# Patient Record
Sex: Male | Born: 1985 | Race: White | Hispanic: No | Marital: Single | Smoking: Former smoker
Health system: Southern US, Community
[De-identification: ages and names within clinical notes are randomized; demographics above are authoritative.]

## PROBLEM LIST (undated history)

## (undated) DIAGNOSIS — S2239XA Fracture of one rib, unspecified side, initial encounter for closed fracture: Secondary | ICD-10-CM

## (undated) DIAGNOSIS — E119 Type 2 diabetes mellitus without complications: Secondary | ICD-10-CM

---

## 2015-12-20 ENCOUNTER — Encounter (HOSPITAL_COMMUNITY): Payer: Self-pay | Admitting: Emergency Medicine

## 2015-12-20 ENCOUNTER — Emergency Department (HOSPITAL_COMMUNITY): Payer: No Typology Code available for payment source

## 2015-12-20 ENCOUNTER — Emergency Department (HOSPITAL_COMMUNITY)
Admission: EM | Admit: 2015-12-20 | Discharge: 2015-12-20 | Disposition: A | Discharge status: Home / Self Care | Payer: No Typology Code available for payment source | Attending: Emergency Medicine | Admitting: Emergency Medicine

## 2015-12-20 DIAGNOSIS — S2231XA Fracture of one rib, right side, initial encounter for closed fracture: Secondary | ICD-10-CM | POA: Insufficient documentation

## 2015-12-20 DIAGNOSIS — W208XXA Other cause of strike by thrown, projected or falling object, initial encounter: Secondary | ICD-10-CM | POA: Insufficient documentation

## 2015-12-20 DIAGNOSIS — Y9389 Activity, other specified: Secondary | ICD-10-CM | POA: Insufficient documentation

## 2015-12-20 DIAGNOSIS — Y998 Other external cause status: Secondary | ICD-10-CM | POA: Insufficient documentation

## 2015-12-20 DIAGNOSIS — S3991XA Unspecified injury of abdomen, initial encounter: Secondary | ICD-10-CM | POA: Insufficient documentation

## 2015-12-20 DIAGNOSIS — Y9289 Other specified places as the place of occurrence of the external cause: Secondary | ICD-10-CM | POA: Insufficient documentation

## 2015-12-20 DIAGNOSIS — F17200 Nicotine dependence, unspecified, uncomplicated: Secondary | ICD-10-CM | POA: Insufficient documentation

## 2015-12-20 DIAGNOSIS — R109 Unspecified abdominal pain: Secondary | ICD-10-CM

## 2015-12-20 LAB — I-STAT CHEM 8, ED
BUN: 14 mg/dL (ref 6–20)
CREATININE: 1 mg/dL (ref 0.61–1.24)
Calcium, Ion: 1.16 mmol/L (ref 1.12–1.23)
Chloride: 103 mmol/L (ref 101–111)
GLUCOSE: 186 mg/dL — AB (ref 65–99)
HCT: 48 % (ref 39.0–52.0)
Hemoglobin: 16.3 g/dL (ref 13.0–17.0)
POTASSIUM: 4.1 mmol/L (ref 3.5–5.1)
Sodium: 139 mmol/L (ref 135–145)
TCO2: 26 mmol/L (ref 0–100)

## 2015-12-20 MED ORDER — OXYCODONE-ACETAMINOPHEN 5-325 MG PO TABS
1.0000 | ORAL_TABLET | Freq: Once | ORAL | Status: AC
  Administered 2015-12-20: 1 via ORAL
  Filled 2015-12-20: qty 1

## 2015-12-20 MED ORDER — IOPAMIDOL (ISOVUE-300) INJECTION 61%
100.0000 mL | Freq: Once | INTRAVENOUS | Status: AC | PRN
  Administered 2015-12-20: 100 mL via INTRAVENOUS

## 2015-12-20 MED ORDER — OXYCODONE-ACETAMINOPHEN 5-325 MG PO TABS: 2.0000 | ORAL_TABLET | Freq: Four times a day (QID) | ORAL | Status: DC | PRN

## 2015-12-20 NOTE — Discharge Instructions (Signed)

## 2015-12-20 NOTE — ED Notes (Signed)
Pt states that he dropped some weights on his R rib cage area 1 week ago[facility] No break but still has pain. Alert and oriented.

## 2015-12-20 NOTE — ED Provider Notes (Signed)
CSN: [id number]     Arrival date & time 12/20/15  1552 History  By signing my name below, I, Linna Darner, attest that this documentation has been prepared under the direction and in the presence of non-physician practitioner, Alveta Heimlich, PA-C. Electronically Signed: Linna Darner, Scribe. 12/20/2015. 4:14 PM.  Chief Complaint  Patient presents with  . Rib Pain    The history is provided by the patient. No language interpreter was used.    HPI Comments: Jackson Turner is a 30 y.o. male with no pertinent PMHx who presents to the Emergency Department complaining of sudden onset, constant, severe, right ribcage pain s/p dropping weights on his right ribcage approximately 1 week ago. Pt notes that he was bench pressing 220 lb without a spotter and became stuck; he was unable to place the bar on the hooks and dropped the weight on his right ribs. Pt has been experiencing excruciating popping sensations in his right ribs. He also notes severe RUQ and epigastric abdominal pain. He reports significant right rib and RUQ pain exacerbation with palpation, movement, breathing, laughing and sneezing. He notes that he has been breathing shallowly to prevent pain. He has been to the [place] for his symptoms and was x-rayed; he was told that he probably has a right rib fracture and discharged with tramadol. Denies difficulty breathing, hemoptysis, nausea or vomiting. Pt denies any other pains at this time.  History reviewed. No pertinent past medical history. History reviewed. No pertinent past surgical history. No family history on file. Social History  Substance Use Topics  . Smoking status: Current Every Day Smoker  . Smokeless tobacco: None  . Alcohol Use: Yes    Review of Systems  Cardiovascular: Positive for chest pain (right ribs).  Gastrointestinal: Positive for abdominal pain (RUQ).  Musculoskeletal: Negative for back pain.  All other systems reviewed and are negative.   Allergies  Review of  patient's allergies indicates no known allergies.  Home Medications   Prior to Admission medications   Medication Sig Start Date End Date Taking? Authorizing Provider  oxyCODONE-acetaminophen (PERCOCET/ROXICET) 5-325 MG tablet Take 2 tablets by mouth every 6 (six) hours as needed for severe pain. 12/20/15   Kazuko Clemence, PA-C   BP 150/111 mmHg  Pulse 98  Temp(Src) 97.7 F (36.5 C) (Oral)  Resp 18  SpO2 100% Physical Exam  Constitutional: He is oriented to person, place, and time. He appears well-developed and well-nourished. No distress.  HENT:  Head: Normocephalic and atraumatic.  Eyes: Conjunctivae and EOM are normal.  Neck: Neck supple. No tracheal deviation present.  Cardiovascular: Normal rate, regular rhythm and normal heart sounds.   Pulmonary/Chest: Effort normal and breath sounds normal. No respiratory distress. He has no wheezes. He has no rales.    Tenderness to right anterior chest wall. No bony deformities. No flail chest, crepitus or retractions. Breathing unlabored. Lungs CTAB with good air movement in all lung fields.   Abdominal: There is tenderness in the right upper quadrant and epigastric area. There is guarding (voluntary). There is no rigidity and no rebound.    Tenderness of the right upper quadrant and epigastric region. No rebound or rigidity. Mild voluntary guarding.  Musculoskeletal: Normal range of motion.  Neurological: He is alert and oriented to person, place, and time.  Skin: Skin is warm and dry.  Psychiatric: He has a normal mood and affect. His behavior is normal.  Nursing note and vitals reviewed.   ED Course  Procedures (including critical care time)  DIAGNOSTIC STUDIES: Oxygen Saturation is 100% on RA, normal by my interpretation.    COORDINATION OF CARE: 4:15 PM Will order DG Ribs Unilateral w/ Chest Right. Will order i-Stat Chem 8. Discussed treatment plan with pt at bedside and pt agreed to plan.  Labs Review Labs Reviewed  I-STAT  CHEM 8, ED - Abnormal; Notable for the following:    Glucose, Bld 186 (*)    All other components within normal limits    Imaging Review Dg Ribs Unilateral W/chest Right  12/20/2015  CLINICAL DATA:  Dropped weight is on right rib cage 1 week ago. Pain to right anterior inferior ribs. EXAM: RIGHT RIBS AND CHEST - 3+ VIEW COMPARISON:  None. FINDINGS: There is a fracture involving the anterior aspect of the right tenth rib. No additional fracture or subluxation identified. No radiopaque foreign bodies. There is no evidence of pneumothorax or pleural effusion. Both lungs are clear. Heart size and mediastinal contours are within normal limits. IMPRESSION: 1. Small fracture involves the anterior aspect of the right tenth rib. Electronically Signed   By: Signa Kellaylor  Stroud M.D.   On: 12/20/2015 17:02   Ct Abdomen Pelvis W Contrast  12/20/2015  CLINICAL DATA:  Sudden onset, constant, severe, right ribcage pain s/p dropping weights on his right ribcage approximately 1 week ago. Pt notes that he was bench pressing 220 lb without a spotter and became stuck; he was unable to place the bar on the hooks and dropped the weight on his right ribs. Pt has been experiencing excruciating popping sensations in his right ribs EXAM: CT ABDOMEN AND PELVIS WITH CONTRAST TECHNIQUE: Multidetector CT imaging of the abdomen and pelvis was performed using the standard protocol following bolus administration of intravenous contrast. CONTRAST:  100mL ISOVUE-300 IOPAMIDOL (ISOVUE-300) INJECTION 61% COMPARISON:  None FINDINGS: Lung bases:  Clear.  Heart normal size. At liver, spleen, gallbladder, pancreas, adrenal glands:  Normal. Kidneys, ureters, bladder:  Unremarkable. Lymph nodes:  No adenopathy. Ascites:  None. Gastrointestinal: Unremarkable. Normal appendix. Normal mesenteric. Abdominal wall: Unremarkable. Musculoskeletal:  No fractures.  Unremarkable. IMPRESSION: 1. Normal enhanced CT scan of the abdomen pelvis. Electronically Signed    By: Amie Portlandavid  Ormond M.D.   On: 12/20/2015 17:51   I have personally reviewed and evaluated these lab results as part of my medical decision-making.   EKG Interpretation None      MDM   Final diagnoses:  Abdominal pain  Right rib fracture, closed, initial encounter   Pt presenting with rib pain after dropping bench press onto his chest 1 week ago. Tenderness over right lateral and anterior chest wall. Patient with good air movement, no hemoptysis and no decreased breath sounds on exam. Right 10th nondisplaced rib fracture and no pneumothorax on x-ray. Abdomen is soft with RUQ and epigastric tenderness with small amount of voluntary guarding. Abd CT without signs of intrabdominal trauma. Patient given incentive spirometer and will discharge home with short course of pain medication. Patient also advised to followup with PCP if not improved in one week. I have also discussed reasons to return immediately to the ER including difficulty breathing and hemoptysis. Patient expresses understanding and agrees with plan.  I personally performed the services described in this documentation, which was scribed in my presence. The recorded information has been reviewed and is accurate.   Alveta HeimlichStevi Sy Saintjean, PA-C 12/20/15 1831  Melene Planan Floyd, DO 12/20/15 2250

## 2015-12-25 ENCOUNTER — Emergency Department (HOSPITAL_COMMUNITY)
Admission: EM | Admit: 2015-12-25 | Discharge: 2015-12-25 | Disposition: A | Discharge status: Home / Self Care | Payer: Non-veteran care | Attending: Emergency Medicine | Admitting: Emergency Medicine

## 2015-12-25 ENCOUNTER — Encounter (HOSPITAL_COMMUNITY): Payer: Self-pay | Admitting: Emergency Medicine

## 2015-12-25 ENCOUNTER — Emergency Department (HOSPITAL_COMMUNITY): Payer: Non-veteran care

## 2015-12-25 DIAGNOSIS — S29001S Unspecified injury of muscle and tendon of front wall of thorax, sequela: Secondary | ICD-10-CM | POA: Diagnosis present

## 2015-12-25 DIAGNOSIS — W208XXS Other cause of strike by thrown, projected or falling object, sequela: Secondary | ICD-10-CM | POA: Insufficient documentation

## 2015-12-25 DIAGNOSIS — F17200 Nicotine dependence, unspecified, uncomplicated: Secondary | ICD-10-CM | POA: Insufficient documentation

## 2015-12-25 DIAGNOSIS — S2241XS Multiple fractures of ribs, right side, sequela: Secondary | ICD-10-CM | POA: Diagnosis not present

## 2015-12-25 DIAGNOSIS — S2231XS Fracture of one rib, right side, sequela: Secondary | ICD-10-CM

## 2015-12-25 MED ORDER — HYDROCODONE-ACETAMINOPHEN 5-325 MG PO TABS: 1.0000 | ORAL_TABLET | Freq: Four times a day (QID) | ORAL | Status: DC | PRN

## 2015-12-25 MED ORDER — HYDROCODONE-ACETAMINOPHEN 5-325 MG PO TABS
1.0000 | ORAL_TABLET | Freq: Once | ORAL | Status: AC
  Administered 2015-12-25: 1 via ORAL
  Filled 2015-12-25: qty 1

## 2015-12-25 MED ORDER — METHOCARBAMOL 500 MG PO TABS: 500.0000 mg | ORAL_TABLET | Freq: Two times a day (BID) | ORAL | Status: DC

## 2015-12-25 MED ORDER — DIAZEPAM 5 MG PO TABS
5.0000 mg | ORAL_TABLET | Freq: Once | ORAL | Status: AC
  Administered 2015-12-25: 5 mg via ORAL
  Filled 2015-12-25: qty 1
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
## 2015-12-25 NOTE — ED Notes (Signed)
Pt c/o R rib pain, pt states he was recently seen for rib fractures and believes he re-injured it/them today doing "something personal". Pt states pain now worse.

## 2015-12-25 NOTE — Discharge Instructions (Signed)
1. Medications: vicodin, robaxin, usual home medications 2. Treatment: rest, drink plenty of fluids,  3. Follow Up: Please followup with your primary doctor in 3-5 days for discussion of your diagnoses and further evaluation after today's visit; if you do not have a primary care doctor use the resource guide provided to find one; Please return to the ER for difficulty breathing, fevers or other concerns    Rib Fracture A rib fracture is a break or crack in one of the bones of the ribs. The ribs are a group of long, curved bones that wrap around your chest and attach to your spine. They protect your lungs and other organs in the chest cavity. A broken or cracked rib is often painful, but most do not cause other problems. Most rib fractures heal on their own over time. However, rib fractures can be more serious if multiple ribs are broken or if broken ribs move out of place and push against other structures. CAUSES   A direct blow to the chest. For example, this could happen during contact sports, a car accident, or a fall against a hard object.  Repetitive movements with high force, such as pitching a baseball or having severe coughing spells. SYMPTOMS   Pain when you breathe in or cough.  Pain when someone presses on the injured area. DIAGNOSIS  Your caregiver will perform a physical exam. Various imaging tests may be ordered to confirm the diagnosis and to look for related injuries. These tests may include a chest X-ray, computed tomography (CT), magnetic resonance imaging (MRI), or a bone scan. TREATMENT  Rib fractures usually heal on their own in 1-3 months. The longer healing period is often associated with a continued cough or other aggravating activities. During the healing period, pain control is very important. Medication is usually given to control pain. Hospitalization or surgery may be needed for more severe injuries, such as those in which multiple ribs are broken or the ribs have  moved out of place.  HOME CARE INSTRUCTIONS   Avoid strenuous activity and any activities or movements that cause pain. Be careful during activities and avoid bumping the injured rib.  Gradually increase activity as directed by your caregiver.  Only take over-the-counter or prescription medications as directed by your caregiver. Do not take other medications without asking your caregiver first.  Apply ice to the injured area for the first 1-2 days after you have been treated or as directed by your caregiver. Applying ice helps to reduce inflammation and pain.  Put ice in a plastic bag.  Place a towel between your skin and the bag.   Leave the ice on for 15-20 minutes at a time, every 2 hours while you are awake.  Perform deep breathing as directed by your caregiver. This will help prevent pneumonia, which is a common complication of a broken rib. Your caregiver may instruct you to:  Take deep breaths several times a day.  Try to cough several times a day, holding a pillow against the injured area.  Use a device called an incentive spirometer to practice deep breathing several times a day.  Drink enough fluids to keep your urine clear or pale yellow. This will help you avoid constipation.   Do not wear a rib belt or binder. These restrict breathing, which can lead to pneumonia.  SEEK IMMEDIATE MEDICAL CARE IF:   You have a fever.   You have difficulty breathing or shortness of breath.   You develop a continual  cough, or you cough up thick or bloody sputum.  You feel sick to your stomach (nausea), throw up (vomit), or have abdominal pain.   You have worsening pain not controlled with medications.  MAKE SURE YOU:  Understand these instructions.  Will watch your condition.  Will get help right away if you are not doing well or get worse.   This information is not intended to replace advice given to you by your health care provider. Make sure you discuss any questions  you have with your health care provider.   Document Released: 08/29/2005 Document Revised: 05/01/2013 Document Reviewed: 10/31/2012 Elsevier Interactive Patient Education [employer]

## 2015-12-30 ENCOUNTER — Emergency Department (HOSPITAL_COMMUNITY): Payer: Non-veteran care

## 2015-12-30 ENCOUNTER — Emergency Department (HOSPITAL_COMMUNITY)
Admission: EM | Admit: 2015-12-30 | Discharge: 2015-12-30 | Disposition: A | Discharge status: Home / Self Care | Payer: Non-veteran care | Attending: Emergency Medicine | Admitting: Emergency Medicine

## 2015-12-30 ENCOUNTER — Encounter (HOSPITAL_COMMUNITY): Payer: Self-pay | Admitting: Emergency Medicine

## 2015-12-30 DIAGNOSIS — Z79899 Other long term (current) drug therapy: Secondary | ICD-10-CM | POA: Insufficient documentation

## 2015-12-30 DIAGNOSIS — Y9289 Other specified places as the place of occurrence of the external cause: Secondary | ICD-10-CM | POA: Diagnosis not present

## 2015-12-30 DIAGNOSIS — Y9389 Activity, other specified: Secondary | ICD-10-CM | POA: Diagnosis not present

## 2015-12-30 DIAGNOSIS — S29001A Unspecified injury of muscle and tendon of front wall of thorax, initial encounter: Secondary | ICD-10-CM | POA: Diagnosis present

## 2015-12-30 DIAGNOSIS — W208XXA Other cause of strike by thrown, projected or falling object, initial encounter: Secondary | ICD-10-CM | POA: Insufficient documentation

## 2015-12-30 DIAGNOSIS — Y998 Other external cause status: Secondary | ICD-10-CM | POA: Diagnosis not present

## 2015-12-30 DIAGNOSIS — S2241XA Multiple fractures of ribs, right side, initial encounter for closed fracture: Secondary | ICD-10-CM | POA: Insufficient documentation

## 2015-12-30 DIAGNOSIS — F17200 Nicotine dependence, unspecified, uncomplicated: Secondary | ICD-10-CM | POA: Diagnosis not present

## 2015-12-30 DIAGNOSIS — S2231XA Fracture of one rib, right side, initial encounter for closed fracture: Secondary | ICD-10-CM

## 2015-12-30 MED ORDER — MELOXICAM 15 MG PO TABS: 15.0000 mg | ORAL_TABLET | Freq: Every day | ORAL | Status: DC

## 2015-12-30 MED ORDER — OXYCODONE-ACETAMINOPHEN 5-325 MG PO TABS: 1.0000 | ORAL_TABLET | Freq: Four times a day (QID) | ORAL | Status: DC | PRN

## 2015-12-30 NOTE — ED Provider Notes (Signed)
CSN: [id number]     Arrival date & time 12/30/15  1921 History  By signing my name below, I, Freida BusmanDiana Omoyeni, attest that this documentation has been prepared under the direction and in the presence of non-physician practitioner, Dorthula Matasiffany G Lizzette Carbonell, PA-C. Electronically Signed: Freida Busmaniana Omoyeni, Scribe. 12/30/2015. 9:51 PM.    Chief Complaint  Patient presents with  . Shortness of Breath    The history is provided by the patient. No language interpreter was used.    HPI Comments:  Jackson Natalodd Klinke is a 30 y.o. male who presents to the Emergency Department complaining of increased pain to specific areas of the right ribs since yesterday after coughing fit. Pt notes he dropped a barbell on his chest ~ 2 weeks ago. He was evaluated in the ED after the incident and diagnosed with right rib fractures. Pt reports associated nausea. He also states he feels like he has not been expelling enough air due to pain.  He denies fever and chills.No alleviating factors noted. He notes he is out of the pain meds he was discharged with. He has not had any fevers, sweats, SOB, dyspnea on exertion.   History reviewed. No pertinent past medical history. History reviewed. No pertinent past surgical history. No family history on file. Social History  Substance Use Topics  . Smoking status: Current Every Day Smoker  . Smokeless tobacco: None  . Alcohol Use: Yes    Review of Systems  Constitutional: Negative for fever.  Musculoskeletal:       + Rib pain   Neurological: Negative for weakness.    Allergies  Review of patient's allergies indicates no known allergies.  Home Medications   Prior to Admission medications   Medication Sig Start Date End Date Taking? Authorizing Provider  buPROPion (WELLBUTRIN XL) 150 MG 24 hr tablet Take 450 mg by mouth daily.   Yes Historical Provider, MD  methocarbamol (ROBAXIN) 500 MG tablet Take 1 tablet (500 mg total) by mouth 2 (two) times daily. 12/25/15  Yes Hannah Muthersbaugh,  PA-C  sodium chloride (OCEAN) 0.65 % SOLN nasal spray Place 1 spray into both nostrils daily as needed for congestion.   Yes Historical Provider, MD  HYDROcodone-acetaminophen (NORCO/VICODIN) 5-325 MG tablet Take 1 tablet by mouth every 6 (six) hours as needed for moderate pain or severe pain. Patient not taking: Reported on 12/30/2015 12/25/15   Dahlia ClientHannah Muthersbaugh, PA-C  meloxicam (MOBIC) 15 MG tablet Take 1 tablet (15 mg total) by mouth daily. 12/30/15   Marlon Peliffany Rehmat Murtagh, PA-C  oxyCODONE-acetaminophen (PERCOCET/ROXICET) 5-325 MG tablet Take 2 tablets by mouth every 6 (six) hours as needed for severe pain. Patient not taking: Reported on 12/30/2015 12/20/15   Rolm GalaStevi Barrett, PA-C  oxyCODONE-acetaminophen (PERCOCET/ROXICET) 5-325 MG tablet Take 1 tablet by mouth every 6 (six) hours as needed for severe pain. 12/30/15   Elek Holderness Neva SeatGreene, PA-C   BP 146/116 mmHg  Pulse 108  Temp(Src) 98 F (36.7 C) (Oral)  Resp 20  SpO2 100% Physical Exam  Constitutional: He is oriented to person, place, and time. He appears well-developed and well-nourished. No distress.  HENT:  Head: Normocephalic and atraumatic.  Eyes: Conjunctivae are normal.  Cardiovascular: Normal rate.   Pulmonary/Chest: Effort normal and breath sounds normal. No accessory muscle usage. No respiratory distress. Chest wall is not dull to percussion. He exhibits tenderness and bony tenderness. He exhibits no mass, no laceration, no crepitus, no edema, no deformity, no swelling and no retraction.    Abdominal: He exhibits no distension.  Neurological: He is alert and oriented to person, place, and time.  Skin: Skin is warm and dry.  Psychiatric: He has a normal mood and affect.  Nursing note and vitals reviewed.   ED Course  Procedures   DIAGNOSTIC STUDIES:  Oxygen Saturation is 100% on RA, normal by my interpretation.    COORDINATION OF CARE:  9:35 PM Pt updated with results. Pt encouraged to use incentive spirometer  at least once a  day. Will refer to ortho and rx for mobic and percocet.  Discussed treatment plan with pt at bedside and pt agreed to plan. The chest xray is showing stable healing with no worsening or acute findings. Lungs are clear on exam. He his oxygen level is 100% on room air. Considered other etiology of pain but he is tender very specifically over his broken ribs and when he moves. Discussed return precautions and he will be given a referral to Ortho today since this is his 3rd visit, I highly recommend he f/u with the specialist.  Imaging Review Dg Ribs Unilateral W/chest Right  12/30/2015  CLINICAL DATA:  Right rib fractures. Worsening right rib pain. Coughing. Dyspnea. Subsequent encounter. EXAM: RIGHT RIBS AND CHEST - 3+ VIEW COMPARISON:  12/25/2015 FINDINGS: Nondisplaced fractures of the right anterior eighth and ninth ribs are again seen, and unchanged in appearance since previous study. No other rib fractures or bone lesions identified. No evidence of pneumothorax or hemothorax. Both lungs are clear. Heart size and mediastinal contours are within normal limits. IMPRESSION: Stable appearance of nondisplaced fractures of right anterior eighth and ninth ribs. No evidence of pneumothorax or other active lung disease. Electronically Signed   By: Myles Rosenthal M.D.   On: 12/30/2015 20:26   I have personally reviewed and evaluated these images and lab results as part of my medical decision-making.    MDM   Final diagnoses:  Rib fractures, right, closed, initial encounter   I personally performed the services described in this documentation, which was scribed in my presence. The recorded information has been reviewed and is accurate.    Marlon Pel, PA-C 12/30/15 2152  Loren Racer, MD 01/02/16 [phone]

## 2015-12-30 NOTE — Discharge Instructions (Signed)

## 2015-12-30 NOTE — ED Notes (Signed)
Patient was alert, oriented and stable upon discharge. RN went over AVS and patient had no further questions.  

## 2015-12-30 NOTE — ED Notes (Signed)
Pt returning to worsening R rib pain known anterior rib fx's. Pt states quality of pain changed yesterday after coughing, pt states he is now having difficulty breathing. Pt he feels as if he is unable to exhale completely. VSS

## 2016-10-31 ENCOUNTER — Encounter (HOSPITAL_COMMUNITY): Payer: Self-pay | Admitting: *Deleted

## 2016-10-31 ENCOUNTER — Emergency Department (HOSPITAL_COMMUNITY)
Admission: EM | Admit: 2016-10-31 | Discharge: 2016-10-31 | Disposition: A | Discharge status: Home / Self Care | Payer: Self-pay | Attending: Emergency Medicine | Admitting: Emergency Medicine

## 2016-10-31 ENCOUNTER — Emergency Department (HOSPITAL_COMMUNITY): Payer: Self-pay

## 2016-10-31 DIAGNOSIS — W19XXXA Unspecified fall, initial encounter: Secondary | ICD-10-CM

## 2016-10-31 DIAGNOSIS — W1789XA Other fall from one level to another, initial encounter: Secondary | ICD-10-CM | POA: Insufficient documentation

## 2016-10-31 DIAGNOSIS — Y929 Unspecified place or not applicable: Secondary | ICD-10-CM | POA: Insufficient documentation

## 2016-10-31 DIAGNOSIS — S161XXA Strain of muscle, fascia and tendon at neck level, initial encounter: Secondary | ICD-10-CM | POA: Insufficient documentation

## 2016-10-31 DIAGNOSIS — Z87891 Personal history of nicotine dependence: Secondary | ICD-10-CM | POA: Insufficient documentation

## 2016-10-31 DIAGNOSIS — Y999 Unspecified external cause status: Secondary | ICD-10-CM | POA: Insufficient documentation

## 2016-10-31 DIAGNOSIS — Z79899 Other long term (current) drug therapy: Secondary | ICD-10-CM | POA: Insufficient documentation

## 2016-10-31 DIAGNOSIS — R930 Abnormal findings on diagnostic imaging of skull and head, not elsewhere classified: Secondary | ICD-10-CM | POA: Insufficient documentation

## 2016-10-31 DIAGNOSIS — S20219A Contusion of unspecified front wall of thorax, initial encounter: Secondary | ICD-10-CM | POA: Insufficient documentation

## 2016-10-31 DIAGNOSIS — Y9302 Activity, running: Secondary | ICD-10-CM | POA: Insufficient documentation

## 2016-10-31 HISTORY — DX: Fracture of one rib, unspecified side, initial encounter for closed fracture: S22.39XA

## 2016-10-31 LAB — COMPREHENSIVE METABOLIC PANEL
ALT: 55 U/L (ref 17–63)
AST: 70 U/L — AB (ref 15–41)
Albumin: 4.4 g/dL (ref 3.5–5.0)
Alkaline Phosphatase: 132 U/L — ABNORMAL HIGH (ref 38–126)
Anion gap: 10 (ref 5–15)
BILIRUBIN TOTAL: 1.1 mg/dL (ref 0.3–1.2)
BUN: 15 mg/dL (ref 6–20)
CO2: 27 mmol/L (ref 22–32)
CREATININE: 1.11 mg/dL (ref 0.61–1.24)
Calcium: 9.6 mg/dL (ref 8.9–10.3)
Chloride: 98 mmol/L — ABNORMAL LOW (ref 101–111)
GFR calc Af Amer: >60 mL/min (ref >60)
Glucose, Bld: 305 mg/dL — ABNORMAL HIGH (ref 65–99)
POTASSIUM: 4.5 mmol/L (ref 3.5–5.1)
Sodium: 135 mmol/L (ref 135–145)
Total Protein: 8.2 g/dL — ABNORMAL HIGH (ref 6.5–8.1)

## 2016-10-31 LAB — CBC WITH DIFFERENTIAL/PLATELET
BASOS ABS: 0 10*3/uL (ref 0.0–0.1)
Basophils Relative: 0 %
Eosinophils Absolute: 0.1 10*3/uL (ref 0.0–0.7)
Eosinophils Relative: 1 %
HCT: 41.6 % (ref 39.0–52.0)
Hemoglobin: 14.5 g/dL (ref 13.0–17.0)
Lymphocytes Relative: 17 %
Lymphs Abs: 1.1 10*3/uL (ref 0.7–4.0)
MCH: 30.1 pg (ref 26.0–34.0)
MCHC: 34.9 g/dL (ref 30.0–36.0)
MCV: 86.3 fL (ref 78.0–100.0)
MONO ABS: 0.6 10*3/uL (ref 0.1–1.0)
Monocytes Relative: 8 %
NEUTROS ABS: 5 10*3/uL (ref 1.7–7.7)
Neutrophils Relative %: 74 %
Platelets: 196 10*3/uL (ref 150–400)
RBC: 4.82 MIL/uL (ref 4.22–5.81)
RDW: 12.5 % (ref 11.5–15.5)
WBC: 6.8 10*3/uL (ref 4.0–10.5)

## 2016-10-31 MED ORDER — ONDANSETRON HCL 4 MG/2ML IJ SOLN
4.0000 mg | Freq: Once | INTRAMUSCULAR | Status: AC
  Administered 2016-10-31: 4 mg via INTRAVENOUS
  Filled 2016-10-31: qty 2

## 2016-10-31 MED ORDER — CYCLOBENZAPRINE HCL 10 MG PO TABS: 10.0000 mg | ORAL_TABLET | Freq: Two times a day (BID) | ORAL | 0 refills | Status: AC | PRN

## 2016-10-31 MED ORDER — KETOROLAC TROMETHAMINE 15 MG/ML IJ SOLN
15.0000 mg | Freq: Once | INTRAMUSCULAR | Status: AC
  Administered 2016-10-31: 15 mg via INTRAVENOUS
  Filled 2016-10-31: qty 1

## 2016-10-31 MED ORDER — SODIUM CHLORIDE 0.9 % IV BOLUS (SEPSIS)
1000.0000 mL | Freq: Once | INTRAVENOUS | Status: AC
Start: 2016-10-31 — End: 2016-10-31
  Administered 2016-10-31: 1000 mL via INTRAVENOUS

## 2016-10-31 MED ORDER — TRAMADOL HCL 50 MG PO TABS: 50.0000 mg | ORAL_TABLET | Freq: Four times a day (QID) | ORAL | 0 refills | Status: AC | PRN

## 2016-10-31 MED ORDER — MORPHINE SULFATE (PF) 4 MG/ML IV SOLN
4.0000 mg | Freq: Once | INTRAVENOUS | Status: AC
  Administered 2016-10-31: 4 mg via INTRAVENOUS
  Filled 2016-10-31: qty 1

## 2016-10-31 MED ORDER — CYCLOBENZAPRINE HCL 10 MG PO TABS
10.0000 mg | ORAL_TABLET | Freq: Once | ORAL | Status: AC
  Administered 2016-10-31: 10 mg via ORAL
  Filled 2016-10-31: qty 1

## 2016-10-31 NOTE — ED Notes (Signed)
DG at bedside. 
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
## 2016-10-31 NOTE — ED Provider Notes (Signed)
[facility]-EMERGENCY DEPT Provider Note   CSN: [id number] Arrival date & time: 10/31/16  1047     History   Chief Complaint Chief Complaint  Patient presents with  . Fall    HPI Jackson Natalodd Wolfrey is a 31 y.o. male.  HPI  Patient [place] [facility] [employer] yesterday and flipped over a rope and fell down hill with grass/dirt approximately 10 feet. Reports that when he fell he landed on the back of his neck. Reports pain was present yesterday but is more severe today. Located over sternum, neck, upper back.  No LOC however nausea today.  Pain with breathing. Denies numbness/weakness.   Past Medical History:  Diagnosis Date  . Rib fracture     There are no active problems to display for this patient.   History reviewed. No pertinent surgical history.     Home Medications    Prior to Admission medications   Medication Sig Start Date End Date Taking? Authorizing Provider  albuterol (PROVENTIL HFA;VENTOLIN HFA) 108 (90 Base) MCG/ACT inhaler Inhale 1-2 puffs into the lungs every 6 (six) hours as needed for wheezing or shortness of breath.   Yes Historical Provider, MD  buPROPion (WELLBUTRIN XL) 300 MG 24 hr tablet Take 300 mg by mouth daily.   Yes Historical Provider, MD  omeprazole (PRILOSEC) 20 MG capsule Take 20 mg by mouth daily.   Yes Historical Provider, MD  sodium chloride (OCEAN) 0.65 % SOLN nasal spray Place 1 spray into both nostrils daily as needed for congestion.   Yes Historical Provider, MD  cyclobenzaprine (FLEXERIL) 10 MG tablet Take 1 tablet (10 mg total) by mouth 2 (two) times daily as needed for muscle spasms. 10/31/16   Alvira MondayErin Nashaly Dorantes, MD  traMADol (ULTRAM) 50 MG tablet Take 1 tablet (50 mg total) by mouth every 6 (six) hours as needed. 10/31/16   Alvira MondayErin Dehlia Kilner, MD    Family History History reviewed. No pertinent family history.  Social History Social History  Substance Use Topics  . Smoking status: Former Games developermoker  . Smokeless tobacco: Never Used  . Alcohol use Yes       Allergies   Patient has no known allergies.   Review of Systems Review of Systems  Constitutional: Negative for fever.  HENT: Negative for sore throat.   Eyes: Negative for visual disturbance.  Respiratory: Negative for shortness of breath.   Cardiovascular: Negative for chest pain.  Gastrointestinal: Positive for nausea. Negative for abdominal pain and vomiting.  Genitourinary: Negative for difficulty urinating.  Musculoskeletal: Positive for back pain and neck pain.  Skin: Negative for rash.  Neurological: Negative for syncope, weakness, numbness and headaches.     Physical Exam Updated Vital Signs BP (!) 154/106 (BP Location: Left Arm)   Pulse 95   Temp 98 F (36.7 C) (Axillary)   Resp 14   Ht 5\' 9"  (1.753 m)   Wt 180 lb (81.6 kg)   SpO2 100%   BMI 26.58 kg/m   Physical Exam  Constitutional: He is oriented to person, place, and time. He appears well-developed and well-nourished. No distress.  HENT:  Head: Normocephalic and atraumatic.  Eyes: Conjunctivae and EOM are normal.  Neck: Normal range of motion.  Cardiovascular: Normal rate, regular rhythm, normal heart sounds and intact distal pulses.  Exam reveals no gallop and no friction rub.   No murmur heard. Pulmonary/Chest: Effort normal and breath sounds normal. No respiratory distress. He has no wheezes. He has no rales.  Abdominal: Soft. He exhibits no distension. There  is no tenderness. There is no guarding.  Musculoskeletal: He exhibits no edema.       Cervical back: He exhibits tenderness (more significant right paraspinal muscle tenderness) and bony tenderness.       Thoracic back: He exhibits tenderness (upper back). He exhibits normal pulse.       Lumbar back: He exhibits no tenderness and no bony tenderness.  Neurological: He is alert and oriented to person, place, and time.  Skin: Skin is warm and dry. He is not diaphoretic.  Nursing note and vitals reviewed.    ED Treatments / Results   Labs (all labs ordered are listed, but only abnormal results are displayed) Labs Reviewed  COMPREHENSIVE METABOLIC PANEL - Abnormal; Notable for the following:       Result Value   Chloride 98 (*)    Glucose, Bld 305 (*)    Total Protein 8.2 (*)    AST 70 (*)    Alkaline Phosphatase 132 (*)    All other components within normal limits  CBC WITH DIFFERENTIAL/PLATELET    EKG  EKG Interpretation  Date/Time:  Monday October 31 2016 12:49:32 EST Ventricular Rate:  95 PR Interval:    QRS Duration: 80 QT Interval:  351 QTC Calculation: 442 R Axis:   76 Text Interpretation:  Sinus rhythm ST elev, probable normal early repol pattern No previous ECGs available Confirmed by [facility] MD, Denny Peon ([id number]) on 10/31/2016 12:55:29 PM       Radiology Dg Thoracic Spine 2 View  Result Date: 10/31/2016 CLINICAL DATA:  Pt was running and could not stop and flipped over a rope (fence for the edge of the trail) and flipped over the rope and fell down about 28foot. Upper back pain. Right wrist pain all over. EXAM: THORACIC SPINE 2 VIEWS COMPARISON:  12/30/2015 FINDINGS: There is no evidence of thoracic spine fracture. Alignment is normal. Cervical spondylitic changes C5-C7. No other significant bone abnormalities are identified. IMPRESSION: 1. Negative thoracic spine. 2. Lower cervical spondylitic changes. Electronically Signed   By: Corlis Leak M.D.   On: 10/31/2016 15:06   Dg Wrist Complete Right  Result Date: 10/31/2016 CLINICAL DATA:  Pt was running and could not stop and flipped over a rope (fence for the edge of the trail) and flipped over the rope and fell down about 3foot. Upper back pain. Right wrist pain all over. EXAM: RIGHT WRIST - COMPLETE 3+ VIEW COMPARISON:  None. FINDINGS: There is no evidence of fracture or dislocation. Corticated ossicle distal to the ulnar styloid. There is no evidence of arthropathy or other focal bone abnormality. Soft tissues are unremarkable. IMPRESSION: Negative  for fracture or other acute finding. Electronically Signed   By: Corlis Leak M.D.   On: 10/31/2016 15:17   Ct Head Wo Contrast  Result Date: 10/31/2016 CLINICAL DATA:  Fall yesterday. EXAM: CT HEAD WITHOUT CONTRAST CT CERVICAL SPINE WITHOUT CONTRAST TECHNIQUE: Multidetector CT imaging of the head and cervical spine was performed following the standard protocol without intravenous contrast. Multiplanar CT image reconstructions of the cervical spine were also generated. COMPARISON:  None. FINDINGS: CT HEAD FINDINGS Brain: No evidence of acute infarction, hemorrhage, hydrocephalus, extra-axial collection or mass lesion/mass effect. Vascular: No hyperdense vessel or unexpected calcification. Skull: Normal. Negative for fracture or focal lesion. Sinuses/Orbits: No acute finding. Other: None CT CERVICAL SPINE FINDINGS Alignment: Normal. Skull base and vertebrae: No acute fracture. No primary bone lesion or focal pathologic process. Soft tissues and spinal canal: No prevertebral fluid or swelling.  No visible canal hematoma. Disc levels:  There is degenerative disc disease at C5-6 and C6-7. Upper chest: Negative. Other: None IMPRESSION: 1. No acute intracranial abnormalities. 2. No evidence for cervical spine fracture. 3. Cervical degenerative disc disease identified. Electronically Signed   By: Signa Kell M.D.   On: 10/31/2016 15:04   Ct Cervical Spine Wo Contrast  Result Date: 10/31/2016 CLINICAL DATA:  Fall yesterday. EXAM: CT HEAD WITHOUT CONTRAST CT CERVICAL SPINE WITHOUT CONTRAST TECHNIQUE: Multidetector CT imaging of the head and cervical spine was performed following the standard protocol without intravenous contrast. Multiplanar CT image reconstructions of the cervical spine were also generated. COMPARISON:  None. FINDINGS: CT HEAD FINDINGS Brain: No evidence of acute infarction, hemorrhage, hydrocephalus, extra-axial collection or mass lesion/mass effect. Vascular: No hyperdense vessel or unexpected  calcification. Skull: Normal. Negative for fracture or focal lesion. Sinuses/Orbits: No acute finding. Other: None CT CERVICAL SPINE FINDINGS Alignment: Normal. Skull base and vertebrae: No acute fracture. No primary bone lesion or focal pathologic process. Soft tissues and spinal canal: No prevertebral fluid or swelling. No visible canal hematoma. Disc levels:  There is degenerative disc disease at C5-6 and C6-7. Upper chest: Negative. Other: None IMPRESSION: 1. No acute intracranial abnormalities. 2. No evidence for cervical spine fracture. 3. Cervical degenerative disc disease identified. Electronically Signed   By: Signa Kell M.D.   On: 10/31/2016 15:04   Dg Chest Portable 1 View  Result Date: 10/31/2016 CLINICAL DATA:  Fall, trauma.  Fall from 10 feet EXAM: PORTABLE CHEST 1 VIEW COMPARISON:  None. FINDINGS: Normal cardiac silhouette. No pulmonary contusion or pleural fluid. No pneumothorax. No fracture. IMPRESSION: No radiographic evidence of thoracic trauma. Electronically Signed   By: Genevive Bi M.D.   On: 10/31/2016 12:53    Procedures Procedures (including critical care time)  Medications Ordered in ED Medications  sodium chloride 0.9 % bolus 1,000 mL (0 mLs Intravenous Stopped 10/31/16 1348)  morphine 4 MG/ML injection 4 mg (4 mg Intravenous Given 10/31/16 1242)  ondansetron (ZOFRAN) injection 4 mg (4 mg Intravenous Given 10/31/16 1242)  cyclobenzaprine (FLEXERIL) tablet 10 mg (10 mg Oral Given 10/31/16 1603)  ketorolac (TORADOL) 15 MG/ML injection 15 mg (15 mg Intravenous Given 10/31/16 1603)     Initial Impression / Assessment and Plan / ED Course  I have reviewed the triage vital signs and the nursing notes.  Pertinent labs & imaging results that were available during my care of the patient were reviewed by me and considered in my medical decision making (see chart for details).     31yo male presents with concern for fall approximately 58ft over hill ledge yesterday with  neck pain, sternum pain, right wrist pain.  XR without acute abnormalities in chest, no pneumonthorax nor hemothorax. No sign of sternal-clavicular dislocation on exam. CT head, CSpine without abnormalities. XR thoracic spine WNL. XR wrist without acute findings. Suspect muscular strain as etiology of symptoms. Given aspen collar as continuing to have midline tenderness, however suspect symptoms muscular given worsening today, muscular tenderness worse on exam.  Recommend continued supportive care, incentive spirometery. Given tramadol for possible occult rib or sternum fx, and rx for flexeril and recommend ibuprofen/tylenol. Patient discharged in stable condition with understanding of reasons to return.   Final Clinical Impressions(s) / ED Diagnoses   Final diagnoses:  Fall, initial encounter  Contusion of sternum, initial encounter  Strain of neck muscle, initial encounter    New Prescriptions Discharge Medication List as of 10/31/2016  4:19 PM  START taking these medications   Details  cyclobenzaprine (FLEXERIL) 10 MG tablet Take 1 tablet (10 mg total) by mouth 2 (two) times daily as needed for muscle spasms., Starting Mon 10/31/2016, Print    traMADol (ULTRAM) 50 MG tablet Take 1 tablet (50 mg total) by mouth every 6 (six) hours as needed., Starting Mon 10/31/2016, Print         Alvira Monday, MD 11/01/16 [phone]

## 2016-11-08 ENCOUNTER — Encounter (HOSPITAL_COMMUNITY): Payer: Self-pay | Admitting: *Deleted

## 2016-11-08 ENCOUNTER — Emergency Department (HOSPITAL_COMMUNITY): Payer: Non-veteran care

## 2016-11-08 ENCOUNTER — Emergency Department (HOSPITAL_COMMUNITY)
Admission: EM | Admit: 2016-11-08 | Discharge: 2016-11-08 | Disposition: A | Discharge status: Home / Self Care | Payer: Non-veteran care | Attending: Emergency Medicine | Admitting: Emergency Medicine

## 2016-11-08 DIAGNOSIS — R079 Chest pain, unspecified: Secondary | ICD-10-CM | POA: Diagnosis present

## 2016-11-08 DIAGNOSIS — M25511 Pain in right shoulder: Secondary | ICD-10-CM | POA: Insufficient documentation

## 2016-11-08 DIAGNOSIS — Z87891 Personal history of nicotine dependence: Secondary | ICD-10-CM | POA: Insufficient documentation

## 2016-11-08 DIAGNOSIS — W010XXD Fall on same level from slipping, tripping and stumbling without subsequent striking against object, subsequent encounter: Secondary | ICD-10-CM | POA: Insufficient documentation

## 2016-11-08 DIAGNOSIS — R0789 Other chest pain: Secondary | ICD-10-CM | POA: Insufficient documentation

## 2016-11-08 MED ORDER — TRAMADOL HCL 50 MG PO TABS: 50.0000 mg | ORAL_TABLET | Freq: Four times a day (QID) | ORAL | 0 refills | Status: AC | PRN

## 2016-11-08 MED ORDER — METHOCARBAMOL 500 MG PO TABS: 500.0000 mg | ORAL_TABLET | Freq: Four times a day (QID) | ORAL | 0 refills | Status: AC | PRN

## 2016-11-08 MED ORDER — METHOCARBAMOL 500 MG PO TABS
500.0000 mg | ORAL_TABLET | Freq: Once | ORAL | Status: AC
  Administered 2016-11-08: 500 mg via ORAL
  Filled 2016-11-08: qty 1

## 2016-11-08 MED ORDER — TRAMADOL HCL 50 MG PO TABS
50.0000 mg | ORAL_TABLET | Freq: Once | ORAL | Status: AC
  Administered 2016-11-08: 50 mg via ORAL
  Filled 2016-11-08: qty 1

## 2016-11-08 NOTE — ED Triage Notes (Signed)
Pt reports he fell ~10 feet last Sunday [place] trail.  Landed on his back.  Was seen here, and had CXR done.  He states he told the EDP then that he was unsure of LOC, but states now that he has thought about it, he think that he did pass out.  He states that he woke up to his wife asking him if he was okay.  He states that the pain in his sternum is worsening ,along with his R upper back.  He also reports he started to have R sided headache and dizziness x 3 days ago.  He states that he has been taking meloxicam and motrin without relief.  Pt also c/o SOB d/t worsening pain when taking a deep breath.  He states that he can't even pick his 577-week old puppy d/t severe pain.   Pt is A&Ox 4.

## 2016-11-08 NOTE — Discharge Instructions (Signed)
Continue taking ibuprofen or meloxicam (but not both). Apply ice to sore areas several times a day.

## 2016-11-08 NOTE — ED Provider Notes (Signed)
[facility]-EMERGENCY DEPT Provider Note   CSN: [id number] Arrival date & time: 11/08/16  1641     History   Chief Complaint Chief Complaint  Patient presents with  . Back Pain  . Chest Pain    HPI Jackson Turner is a 31 y.o. male.  He complains of ongoing pain in his chest, back, neck and is also complaining of headaches. He had been hiking and fell about 10 feet. This occurred 8 days ago. He had been seen in the ED and had x-rays and CT scans done showing no acute injury, but was told that he might have a hairline fracture. His pain is been getting worse over the last 2 days. He has been unable to sleep because of pain. He is also concerned because he initially told the physician that he did not think he had passed out, but he now realizes that he did pass out. He had been taking tramadol initially which did give him significant relief. He had also been prescribed Flexeril which did not seem to give him any relief. He has been taking ibuprofen and meloxicam which are not helping.   The history is provided by the patient.  Back Pain   Associated symptoms include chest pain.  Chest Pain   Associated symptoms include back pain.    Past Medical History:  Diagnosis Date  . Rib fracture     There are no active problems to display for this patient.   History reviewed. No pertinent surgical history.     Home Medications    Prior to Admission medications   Medication Sig Start Date End Date Taking? Authorizing Provider  albuterol (PROVENTIL HFA;VENTOLIN HFA) 108 (90 Base) MCG/ACT inhaler Inhale 1-2 puffs into the lungs every 6 (six) hours as needed for wheezing or shortness of breath.   Yes Historical Provider, MD  atomoxetine (STRATTERA) 40 MG capsule Take 40 mg by mouth daily.   Yes Historical Provider, MD  buPROPion (WELLBUTRIN XL) 300 MG 24 hr tablet Take 300 mg by mouth daily.   Yes Historical Provider, MD  cyclobenzaprine (FLEXERIL) 10 MG tablet Take 1 tablet (10 mg total) by  mouth 2 (two) times daily as needed for muscle spasms. 10/31/16  Yes Alvira Monday, MD  metoCLOPramide (REGLAN) 10 MG tablet Take 10 mg by mouth daily.   Yes Historical Provider, MD  omeprazole (PRILOSEC) 20 MG capsule Take 20 mg by mouth daily.   Yes Historical Provider, MD  sodium chloride (OCEAN) 0.65 % SOLN nasal spray Place 1 spray into both nostrils daily as needed for congestion.   Yes Historical Provider, MD  traMADol (ULTRAM) 50 MG tablet Take 1 tablet (50 mg total) by mouth every 6 (six) hours as needed. Patient taking differently: Take 50 mg by mouth every 6 (six) hours as needed for severe pain.  10/31/16  Yes Alvira Monday, MD    Family History No family history on file.  Social History Social History  Substance Use Topics  . Smoking status: Former Games developer  . Smokeless tobacco: Never Used  . Alcohol use Yes     Allergies   Patient has no known allergies.   Review of Systems Review of Systems  Cardiovascular: Positive for chest pain.  Musculoskeletal: Positive for back pain.  All other systems reviewed and are negative.    Physical Exam Updated Vital Signs BP (!) 150/108 (BP Location: Left Arm)   Pulse 94   Temp 98.1 F (36.7 C) (Oral)   Resp 18  SpO2 100%   Physical Exam  Nursing note and vitals reviewed.  31 year old male, resting comfortably and in no acute distress. Vital signs are significant for hypertension. Oxygen saturation is 100%, which is normal. Head is normocephalic and atraumatic. PERRLA, EOMI. Oropharynx is clear. Neck is mildly tender posteriorly without adenopathy or JVD. Back is nontender and there is no CVA tenderness. Lungs are clear without rales, wheezes, or rhonchi. Chest is tender in the right lateral rib cage. Heart has regular rate and rhythm without murmur. Abdomen is soft, flat, nontender without masses or hepatosplenomegaly and peristalsis is normoactive. Extremities have no cyanosis or edema, full range of motion is  present. There is pain elicited in the right shoulder with passive range of motion, but no definite point tenderness. Skin is warm and dry without rash. Neurologic: Mental status is normal, cranial nerves are intact, there are no motor or sensory deficits.  ED Treatments / Results   Radiology Dg Chest 2 View  Result Date: 11/08/2016 CLINICAL DATA:  Chest pain with tachycardia today. Patient fell ten days ago. History of right-sided rib fractures. EXAM: CHEST  2 VIEW COMPARISON:  Radiographs 12/30/2015, 10/31/2016. FINDINGS: The heart size and mediastinal contours are stable without evidence of mediastinal hematoma. There is chronic asymmetric elevation of the right hemidiaphragm. The lungs are clear. No evidence of pleural effusion, pneumothorax or acute fracture. IMPRESSION: Stable chest. No active cardiopulmonary process or acute posttraumatic findings seen. Electronically Signed   By: Carey BullocksWilliam  Veazey M.D.   On: 11/08/2016 17:32   Dg Shoulder Right  Result Date: 11/08/2016 CLINICAL DATA:  Status post fall 10 feet from [place]ide 1 week ago while hiking, with right superior shoulder pain. Initial encounter. EXAM: RIGHT SHOULDER - 2+ VIEW COMPARISON:  None. FINDINGS: There is no evidence of fracture or dislocation. The right humeral head is seated within the glenoid fossa. The acromioclavicular joint is unremarkable in appearance. No significant soft tissue abnormalities are seen. The visualized portions of the right lung are clear. IMPRESSION: No evidence of fracture or dislocation. Electronically Signed   By: Roanna RaiderJeffery  Chang M.D.   On: 11/08/2016 22:03    Procedures Procedures (including critical care time)  Medications Ordered in ED Medications  traMADol (ULTRAM) tablet 50 mg (not administered)  methocarbamol (ROBAXIN) tablet 500 mg (not administered)     Initial Impression / Assessment and Plan / ED Course  I have reviewed the triage vital signs and the nursing notes.  Pertinent  imaging results that were available during my care of the patient were reviewed by me and considered in my medical decision making (see chart for details).  Ongoing chest, back, neck and head pain following fall. Old records reviewed and he did have CT of head and cervical spine as well as x-rays of thoracic spine and chest which were all read as negative. Symptoms are consistent with the injury that he severed. The only injury which had not been adequately imaged bases right shoulder and he is sent for x-rays of that. X-rays of the chest had been ordered at triage and show no obvious fractures. Patient is advised that he already had appropriate imaging for loss of consciousness and I do not see any need for repeat CT scanning. He will be given new prescription for tramadol, and will give him a trial of methocarbamol.  Shoulder x-rays are unremarkable. He is given above noted prescriptions and is referred back to his PCP.  Final Clinical Impressions(s) / ED Diagnoses   Final  diagnoses:  Fall from slip, trip, or stumble, subsequent encounter  Chest wall pain  Acute pain of right shoulder    New Prescriptions New Prescriptions   METHOCARBAMOL (ROBAXIN) 500 MG TABLET    Take 1 tablet (500 mg total) by mouth every 6 (six) hours as needed for muscle spasms.   TRAMADOL (ULTRAM) 50 MG TABLET    Take 1 tablet (50 mg total) by mouth every 6 (six) hours as needed.     Dione Booze, MD 11/08/16 [phone]

## 2018-12-12 IMAGING — CR DG SHOULDER 2+V*R*
3 series · 3 of 3 positions shown · non-contrast
Comparison: None.

CLINICAL DATA: Status post fall 10 feet from mountainside 1 week
ago while hiking, with right superior shoulder pain. Initial
encounter.

EXAM:
RIGHT SHOULDER - 2+ VIEW

[w shoulder external right]
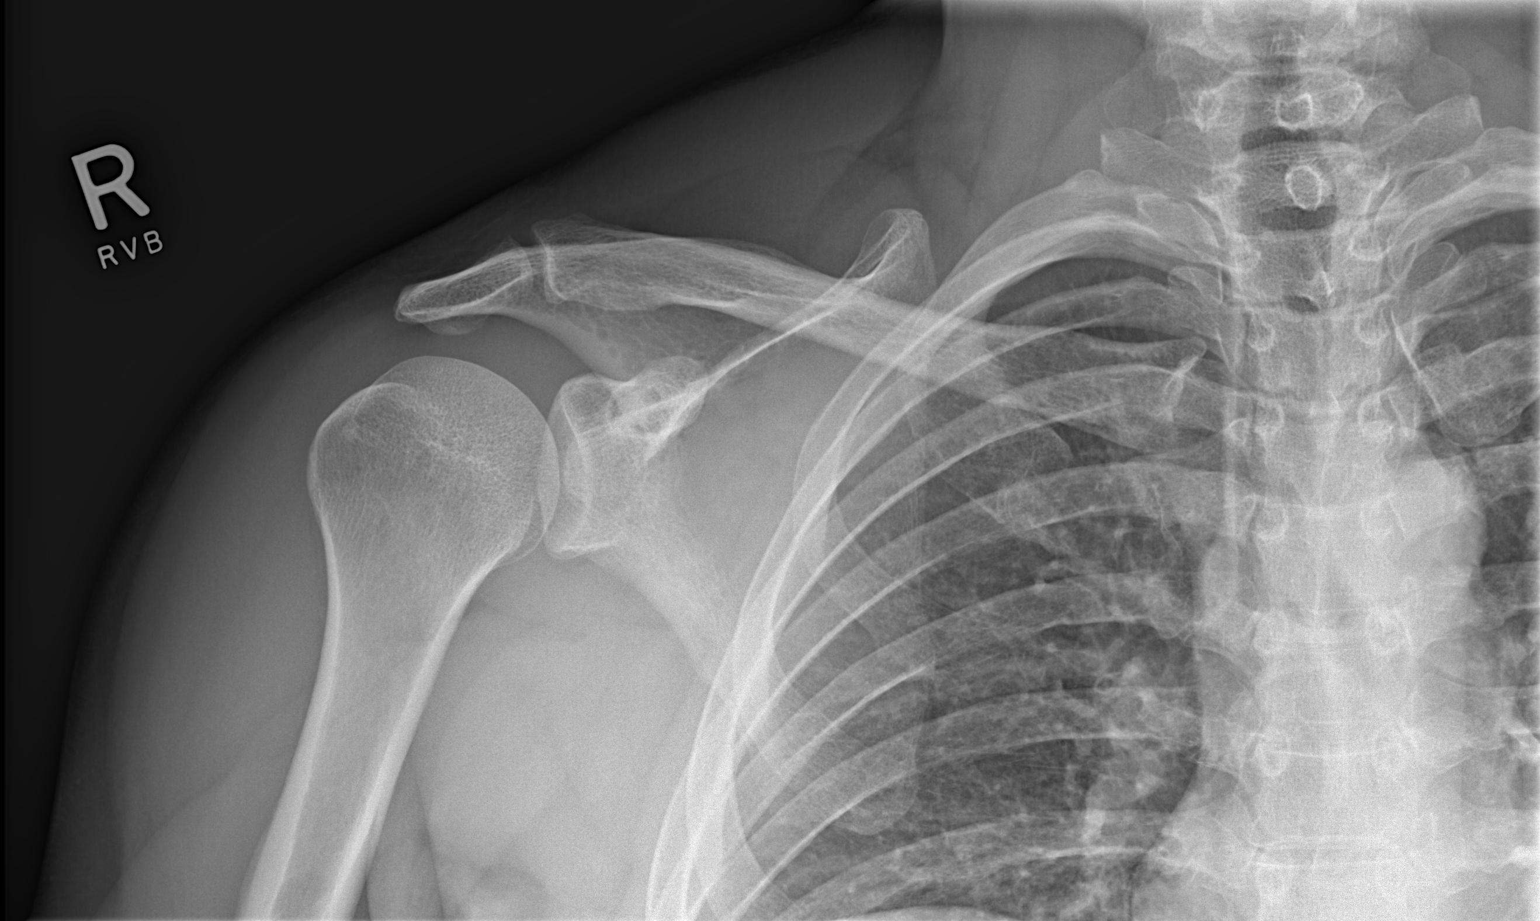

[w shoulder y-view right]
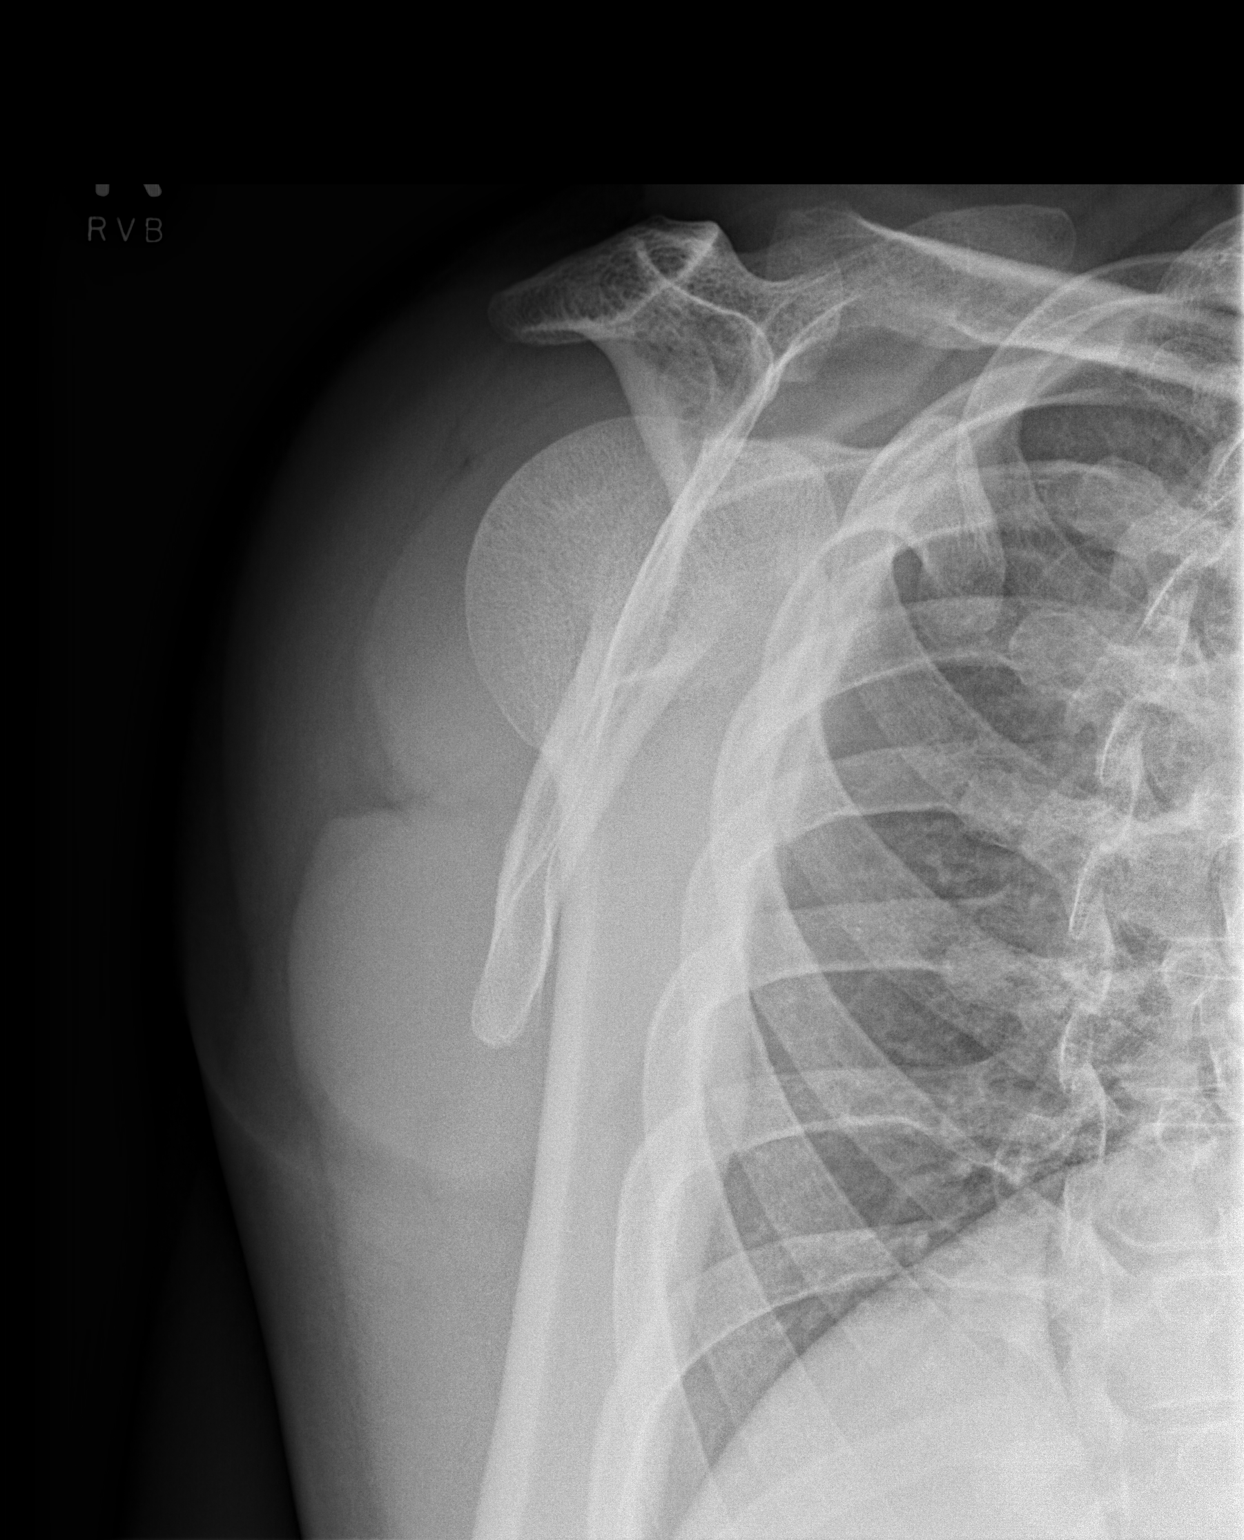

[x shoulder axillary right]
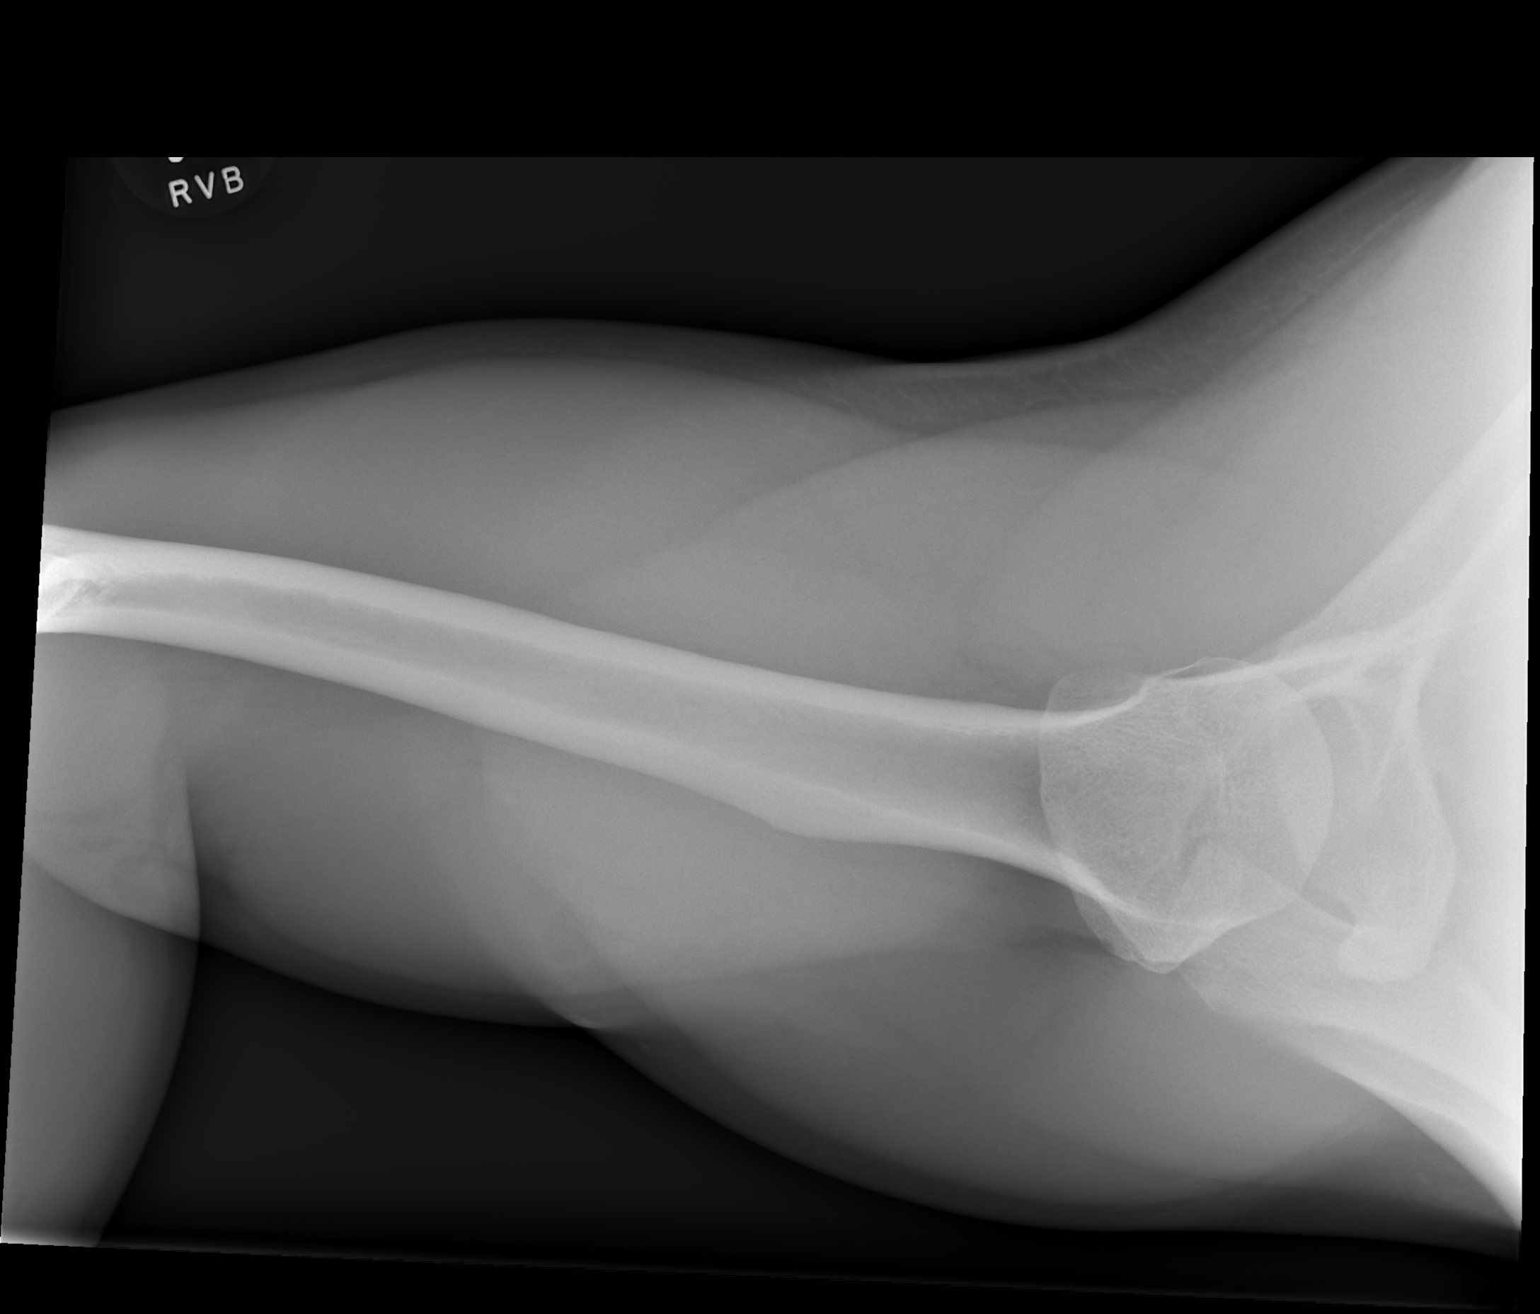

[3 of 3 positions shown; findings below may reference images not displayed]

FINDINGS: There is no evidence of fracture or dislocation. The right humeral
head is seated within the glenoid fossa. The acromioclavicular joint
is unremarkable in appearance. No significant soft tissue
abnormalities are seen. The visualized portions of the right lung
are clear.
IMPRESSION: No evidence of fracture or dislocation.

## 2024-04-20 ENCOUNTER — Encounter (HOSPITAL_BASED_OUTPATIENT_CLINIC_OR_DEPARTMENT_OTHER): Payer: Self-pay | Admitting: Emergency Medicine

## 2024-04-20 ENCOUNTER — Emergency Department (HOSPITAL_BASED_OUTPATIENT_CLINIC_OR_DEPARTMENT_OTHER)

## 2024-04-20 ENCOUNTER — Emergency Department (HOSPITAL_BASED_OUTPATIENT_CLINIC_OR_DEPARTMENT_OTHER)
Admission: EM | Admit: 2024-04-20 | Discharge: 2024-04-20 | Disposition: A | Discharge status: Home / Self Care | Attending: Emergency Medicine | Admitting: Emergency Medicine

## 2024-04-20 ENCOUNTER — Other Ambulatory Visit: Payer: Self-pay

## 2024-04-20 DIAGNOSIS — E109 Type 1 diabetes mellitus without complications: Secondary | ICD-10-CM | POA: Insufficient documentation

## 2024-04-20 DIAGNOSIS — R55 Syncope and collapse: Secondary | ICD-10-CM

## 2024-04-20 DIAGNOSIS — W228XXA Striking against or struck by other objects, initial encounter: Secondary | ICD-10-CM | POA: Diagnosis not present

## 2024-04-20 DIAGNOSIS — S0990XA Unspecified injury of head, initial encounter: Secondary | ICD-10-CM | POA: Diagnosis present

## 2024-04-20 DIAGNOSIS — E860 Dehydration: Secondary | ICD-10-CM | POA: Diagnosis not present

## 2024-04-20 DIAGNOSIS — S060X1A Concussion with loss of consciousness of 30 minutes or less, initial encounter: Secondary | ICD-10-CM | POA: Insufficient documentation

## 2024-04-20 DIAGNOSIS — S01512A Laceration without foreign body of oral cavity, initial encounter: Secondary | ICD-10-CM | POA: Insufficient documentation

## 2024-04-20 HISTORY — DX: Type 2 diabetes mellitus without complications: E11.9

## 2024-04-20 LAB — URINALYSIS, ROUTINE W REFLEX MICROSCOPIC
Bilirubin Urine: NEGATIVE
Glucose, UA: 250 mg/dL — AB
Hgb urine dipstick: NEGATIVE
Ketones, ur: NEGATIVE mg/dL
Leukocytes,Ua: NEGATIVE
Nitrite: NEGATIVE
Protein, ur: NEGATIVE mg/dL
Specific Gravity, Urine: 1.025 (ref 1.005–1.030)
pH: 5.5 (ref 5.0–8.0)

## 2024-04-20 LAB — COMPREHENSIVE METABOLIC PANEL WITH GFR
ALT: 33 U/L (ref 0–44)
AST: 44 U/L — ABNORMAL HIGH (ref 15–41)
Albumin: 4 g/dL (ref 3.5–5.0)
Alkaline Phosphatase: 74 U/L (ref 38–126)
Anion gap: 11 (ref 5–15)
BUN: 12 mg/dL (ref 6–20)
CO2: 22 mmol/L (ref 22–32)
Calcium: 9.2 mg/dL (ref 8.9–10.3)
Chloride: 105 mmol/L (ref 98–111)
Creatinine, Ser: 1.42 mg/dL — ABNORMAL HIGH (ref 0.61–1.24)
GFR, Estimated: >60 mL/min (ref >60)
Glucose, Bld: 179 mg/dL — ABNORMAL HIGH (ref 70–99)
Potassium: 3.9 mmol/L (ref 3.5–5.1)
Sodium: 138 mmol/L (ref 135–145)
Total Bilirubin: 0.3 mg/dL (ref 0.0–1.2)
Total Protein: 6.3 g/dL — ABNORMAL LOW (ref 6.5–8.1)

## 2024-04-20 LAB — CBC WITH DIFFERENTIAL/PLATELET
Abs Immature Granulocytes: 0.01 K/uL (ref 0.00–0.07)
Basophils Absolute: 0.1 K/uL (ref 0.0–0.1)
Basophils Relative: 1 %
Eosinophils Absolute: 0.3 K/uL (ref 0.0–0.5)
Eosinophils Relative: 5 %
HCT: 38.6 % — ABNORMAL LOW (ref 39.0–52.0)
Hemoglobin: 12.9 g/dL — ABNORMAL LOW (ref 13.0–17.0)
Immature Granulocytes: 0 %
Lymphocytes Relative: 45 %
Lymphs Abs: 2.8 K/uL (ref 0.7–4.0)
MCH: 28.2 pg (ref 26.0–34.0)
MCHC: 33.4 g/dL (ref 30.0–36.0)
MCV: 84.3 fL (ref 80.0–100.0)
Monocytes Absolute: 0.6 K/uL (ref 0.1–1.0)
Monocytes Relative: 9 %
Neutro Abs: 2.5 K/uL (ref 1.7–7.7)
Neutrophils Relative %: 40 %
Platelets: 352 K/uL (ref 150–400)
RBC: 4.58 MIL/uL (ref 4.22–5.81)
RDW: 13.2 % (ref 11.5–15.5)
WBC: 6.2 K/uL (ref 4.0–10.5)
nRBC: 0 % (ref 0.0–0.2)

## 2024-04-20 LAB — I-STAT VENOUS BLOOD GAS, ED
Acid-base deficit: 2 mmol/L (ref 0.0–2.0)
Bicarbonate: 22.3 mmol/L (ref 20.0–28.0)
Calcium, Ion: 1.17 mmol/L (ref 1.15–1.40)
HCT: 39 % (ref 39.0–52.0)
Hemoglobin: 13.3 g/dL (ref 13.0–17.0)
O2 Saturation: 91 %
Potassium: 3.8 mmol/L (ref 3.5–5.1)
Sodium: 139 mmol/L (ref 135–145)
TCO2: 23 mmol/L (ref 22–32)
pCO2, Ven: 35.2 mmHg — ABNORMAL LOW (ref 44–60)
pH, Ven: 7.41 (ref 7.25–7.43)
pO2, Ven: 61 mmHg — ABNORMAL HIGH (ref 32–45)

## 2024-04-20 LAB — CK: Total CK: 759 U/L — ABNORMAL HIGH (ref 49–397)

## 2024-04-20 LAB — MAGNESIUM: Magnesium: 1.7 mg/dL (ref 1.7–2.4)

## 2024-04-20 LAB — TROPONIN T, HIGH SENSITIVITY
Troponin T High Sensitivity: <15 ng/L (ref <19)
Troponin T High Sensitivity: <15 ng/L (ref <19)

## 2024-04-20 MED ORDER — KETOROLAC TROMETHAMINE 15 MG/ML IJ SOLN
15.0000 mg | Freq: Once | INTRAMUSCULAR | Status: AC
  Administered 2024-04-20: 15 mg via INTRAVENOUS
  Filled 2024-04-20: qty 1

## 2024-04-20 MED ORDER — LACTATED RINGERS IV BOLUS
1000.0000 mL | Freq: Once | INTRAVENOUS | Status: AC
  Administered 2024-04-20: 1000 mL via INTRAVENOUS

## 2024-04-20 MED ORDER — KETAMINE HCL 10 MG/ML IJ SOLN
0.3000 mg/kg | Freq: Once | INTRAMUSCULAR | Status: AC
  Administered 2024-04-20: 29 mg via INTRAVENOUS
  Filled 2024-04-20: qty 1

## 2024-04-20 MED ORDER — ACETAMINOPHEN 500 MG PO TABS
1000.0000 mg | ORAL_TABLET | Freq: Once | ORAL | Status: AC
  Administered 2024-04-20: 1000 mg via ORAL
  Filled 2024-04-20: qty 2

## 2024-04-20 NOTE — ED Triage Notes (Signed)
 Pt was on couch got up, then woke up over coffee table.

## 2024-04-20 NOTE — ED Provider Notes (Signed)
 [place] [facility] Provider Note   CSN: [id number] Arrival date & time: 04/20/24  9356     Patient presents with: Jackson   Kutler Turner is a 38 y.o. male.   Patient is a 38 year old male with a history of type 1 diabetes on an insulin pump, ADHD who is presenting today with complaints of syncope.  Patient reports the first episode was approximately 1 week ago and then the second episode was this morning.  Patient reports that both times it occurred in the morning when he was getting up off of the couch and then he woke up on the ground.  He reports the episode last week did not result in any injury.  He does not really remember anything about the event but reports throughout this week he has not been feeling great.  He had been taking kratom for the last 7 months.  He had been buying it online and taking approximately 60 mg a day to help him curb his appetite, lose weight and control his A1c.  He did not realize how addictive it was and that he would go through withdrawal symptoms.  Approximately 4 days ago he ran out of money and stopped buying the kratom.  He started having generalized muscle aches, nausea, chills and sweats.  He called and spoke with his doctor and 2 days ago was started on Suboxone.  He reports overall he has tried to continue his nutrition and eating and drinking liquids.  He has not had any chest pain, shortness of breath or palpitations.  He denies any abdominal pain but just reports achiness in his thighs and arms.  He states that this morning he woke up and as he stood up can briefly recall thinking maybe he should sit back down but the next thing he remembers is waking up face down on his coffee table after he broke it.  He had hit his face and his right shoulder.  He does have a cut on the inside of his mouth and his teeth feel sore.  He is also having pain when he moves his right shoulder.  Still denying any chest pain or shortness of  breath.  No unilateral leg swelling.  Other than the Suboxone all of his other medications he has been on for a while and has had no new additions.  He denies any urinary symptoms.  No prior history of syncope in the past.  He looked at his pump and his blood sugars have been stable and he has had no low sugars.  Based on the last 24 hours his sugar has been mostly in range with 1 spike above ideal.  Patient reports he was able to walk him from the car to the waiting room and did not feel lightheaded or dizzy at that time.  The history is provided by the patient and medical records.  Fall       Prior to Admission medications   Medication Sig Start Date End Date Taking? Authorizing Provider  albuterol (PROVENTIL HFA;VENTOLIN HFA) 108 (90 Base) MCG/ACT inhaler Inhale 1-2 puffs into the lungs every 6 (six) hours as needed for wheezing or shortness of breath.    [provider]  atomoxetine (STRATTERA) 40 MG capsule Take 40 mg by mouth daily.    [provider]  buPROPion (WELLBUTRIN XL) 300 MG 24 hr tablet Take 300 mg by mouth daily.    [provider]  cyclobenzaprine  (FLEXERIL ) 10 MG tablet  Take 1 tablet (10 mg total) by mouth 2 (two) times daily as needed for muscle spasms. 10/31/16   Dreama Longs, MD  methocarbamol  (ROBAXIN ) 500 MG tablet Take 1 tablet (500 mg total) by mouth every 6 (six) hours as needed for muscle spasms. 11/08/16   Raford Lenis, MD  metoCLOPramide (REGLAN) 10 MG tablet Take 10 mg by mouth daily.    [provider]  omeprazole (PRILOSEC) 20 MG capsule Take 20 mg by mouth daily.    [provider]  sodium chloride  (OCEAN) 0.65 % SOLN nasal spray Place 1 spray into both nostrils daily as needed for congestion.    [provider]  traMADol  (ULTRAM ) 50 MG tablet Take 1 tablet (50 mg total) by mouth every 6 (six) hours as needed. Patient taking differently: Take 50 mg by mouth every 6 (six) hours as needed for severe pain.   10/31/16   Dreama Longs, MD  traMADol  (ULTRAM ) 50 MG tablet Take 1 tablet (50 mg total) by mouth every 6 (six) hours as needed. 11/08/16   Raford Lenis, MD    Allergies: Patient has no known allergies.    Review of Systems  Updated Vital Signs BP 113/82   Pulse 83   Temp 98.7 F (37.1 C) (Oral)   Resp 19   Ht 5' 8 (1.727 m)   Wt 95.3 kg   SpO2 97%   BMI 31.93 kg/m   Physical Exam Vitals and nursing note reviewed.  Constitutional:      General: He is not in acute distress.    Appearance: He is well-developed.  HENT:     Head: Normocephalic and atraumatic.     Right Ear: Tympanic membrane normal.     Left Ear: Tympanic membrane normal.     Mouth/Throat:     Comments: 2 cm laceration noted on the right upper inner lip.  No through and through injury.  Teeth are intact Eyes:     Conjunctiva/sclera: Conjunctivae normal.     Pupils: Pupils are equal, round, and reactive to light.  Cardiovascular:     Rate and Rhythm: Normal rate and regular rhythm.     Heart sounds: No murmur heard. Pulmonary:     Effort: Pulmonary effort is normal. No respiratory distress.     Breath sounds: Normal breath sounds. No wheezing or rales.  Abdominal:     General: There is no distension.     Palpations: Abdomen is soft.     Tenderness: There is no abdominal tenderness. There is no guarding or rebound.  Musculoskeletal:        General: Tenderness present. Normal range of motion.     Right shoulder: Tenderness and bony tenderness present.       Arms:     Cervical back: Normal range of motion and neck supple.     Comments: Mild diffuse tenderness in bilateral thighs and deltoids with palpation.  No swelling noted.  Skin:    General: Skin is warm and dry.     Findings: No erythema or rash.  Neurological:     Mental Status: He is alert and oriented to person, place, and time. Mental status is at baseline.     Sensory: No sensory deficit.     Motor: No weakness.     Gait: Gait normal.   Psychiatric:        Behavior: Behavior normal.     (all labs ordered are listed, but only abnormal results are displayed) Labs Reviewed  CBC WITH  DIFFERENTIAL/PLATELET - Abnormal; Notable for the following components:      Result Value   Hemoglobin 12.9 (*)    HCT 38.6 (*)    All other components within normal limits  COMPREHENSIVE METABOLIC PANEL WITH GFR - Abnormal; Notable for the following components:   Glucose, Bld 179 (*)    Creatinine, Ser 1.42 (*)    Total Protein 6.3 (*)    AST 44 (*)    All other components within normal limits  CK - Abnormal; Notable for the following components:   Total CK 759 (*)    All other components within normal limits  URINALYSIS, ROUTINE W REFLEX MICROSCOPIC - Abnormal; Notable for the following components:   Glucose, UA 250 (*)    All other components within normal limits  I-STAT VENOUS BLOOD GAS, ED - Abnormal; Notable for the following components:   pCO2, Ven 35.2 (*)    pO2, Ven 61 (*)    All other components within normal limits  MAGNESIUM  TROPONIN T, HIGH SENSITIVITY  TROPONIN T, HIGH SENSITIVITY    EKG: EKG Interpretation Date/Time:  Saturday April 20 2024 08:00:39 EDT Ventricular Rate:  81 PR Interval:  134 QRS Duration:  99 QT Interval:  359 QTC Calculation: 417 R Axis:   79  Text Interpretation: Sinus rhythm Normal ECG No significant change since last tracing Confirmed by Doretha Folks ([id number]) on 04/20/2024 8:13:07 AM  Radiology: [facility]COLA Shoulder Right Result Date: 04/20/2024 CLINICAL DATA:  Syncope.  Injury. EXAM: RIGHT SHOULDER - 2+ VIEW COMPARISON:  None Available. FINDINGS: There is no evidence of fracture or dislocation. There is no evidence of arthropathy or other focal bone abnormality. Soft tissues are unremarkable. IMPRESSION: Negative. Electronically Signed   By: Camellia Candle M.D.   On: 04/20/2024 08:20   DG Chest 2 View Result Date: 04/20/2024 CLINICAL DATA:  Syncope.  Fall. EXAM: CHEST - 2 VIEW COMPARISON:   11/08/2016 FINDINGS: Stable mild asymmetric elevation right hemidiaphragm. The lungs are clear without focal pneumonia, edema, pneumothorax or pleural effusion. Interstitial markings are diffusely coarsened with chronic features. Cardiopericardial silhouette is at upper limits of normal for size. No acute bony abnormality. IMPRESSION: Chronic interstitial coarsening without acute cardiopulmonary findings. Electronically Signed   By: Camellia Candle M.D.   On: 04/20/2024 08:19   CT Cervical Spine Wo Contrast Result Date: 04/20/2024 EXAM: CT CERVICAL SPINE WITHOUT CONTRAST 04/20/2024 07:42:59 AM TECHNIQUE: CT of the cervical spine was performed without the administration of intravenous contrast. Multiplanar reformatted images are provided for review. Automated exposure control, iterative reconstruction, and/or weight based adjustment of the mA/kV was utilized to reduce the radiation dose to as low as reasonably achievable. COMPARISON: None available. CLINICAL HISTORY: Neck trauma, midline tenderness (Age 65-64y). Pt was on couch got up, then woke up over coffee table. Possible syncope, fall today. FINDINGS: CERVICAL SPINE: BONES AND ALIGNMENT: Straightening of the normal cervical lordosis is noted. No significant listhesis is present. DEGENERATIVE CHANGES: Vertebral spurring leads to moderate left foraminal narrowing at C5-6 and moderate foraminal narrowing bilaterally at C6-7. SOFT TISSUES: No prevertebral soft tissue swelling. IMPRESSION: 1. No acute abnormality of the cervical spine related to the reported trauma. 2. Straightening of the normal cervical lordosis. 3. Moderate left foraminal narrowing at C5-6 and moderate foraminal narrowing bilaterally at C6-7 due to vertebral spurring. Electronically signed by: Lonni Necessary MD 04/20/2024 08:03 AM EDT RP Workstation: [id number]   CT Maxillofacial Wo Contrast Result Date: 04/20/2024 EXAM: CT OF THE FACE WITHOUT CONTRAST 04/20/2024 07:42:59  AM TECHNIQUE: CT  of the face was performed without the administration of intravenous contrast. Multiplanar reformatted images are provided for review. Automated exposure control, iterative reconstruction, and/or weight based adjustment of the mA/kV was utilized to reduce the radiation dose to as low as reasonably achievable. COMPARISON: None available. CLINICAL HISTORY: Facial trauma, blunt. Pt was on couch got up, then woke up over coffee table. Possible syncope, fall today. FINDINGS: FACIAL BONES: No acute facial fracture. No mandibular dislocation. No suspicious bone lesion. ORBITS: Globes are intact. No acute traumatic injury. No inflammatory change. SINUSES AND MASTOIDS: Partial opacification of the medial right frontal sinus is noted. A left mastoid effusion is present. No underlying fracture or nasopharyngeal obstruction is present. SOFT TISSUES: No acute abnormality. IMPRESSION: 1. No acute facial fracture. 2. Partial opacification of the medial right frontal sinus and left mastoid effusion. Electronically signed by: Lonni Necessary MD 04/20/2024 08:00 AM EDT RP Workstation: [id number]   CT Head Wo Contrast Result Date: 04/20/2024 EXAM: CT HEAD WITHOUT CONTRAST 04/20/2024 07:42:59 AM TECHNIQUE: CT of the head was performed without the administration of intravenous contrast. Automated exposure control, iterative reconstruction, and/or weight based adjustment of the mA/kV was utilized to reduce the radiation dose to as low as reasonably achievable. COMPARISON: None available. CLINICAL HISTORY: Head trauma, moderate-severe. Pt was on couch got up, then woke up over coffee table. Possible syncope, fall today. FINDINGS: BRAIN AND VENTRICLES: No acute hemorrhage. Gray-white differentiation is preserved. No hydrocephalus. No extra-axial collection. No mass effect or midline shift. ORBITS: No acute abnormality. SINUSES: Partial opacification of the left frontal sinus is noted. SOFT TISSUES AND SKULL: No acute soft tissue  abnormality. No skull fracture. A left mastoid effusion is present. No nasopharyngeal obstruction. IMPRESSION: 1. No acute intracranial abnormality. 2. Partial opacification of the left frontal sinus and left mastoid effusion. No nasopharyngeal obstruction or fracture. Electronically signed by: Lonni Necessary MD 04/20/2024 07:58 AM EDT RP Workstation: [id number]     Procedures   Medications Ordered in the ED  lactated ringers  bolus 1,000 mL (1,000 mLs Intravenous New Bag/Given 04/20/24 0757)  acetaminophen  (TYLENOL ) tablet 1,000 mg (1,000 mg Oral Given 04/20/24 0728)  ketamine  (KETALAR ) injection 29 mg (29 mg Intravenous Given 04/20/24 0839)  ketorolac  (TORADOL ) 15 MG/ML injection 15 mg (15 mg Intravenous Given 04/20/24 9161)                                    Medical Decision Making Amount and/or Complexity of Data Reviewed External Data Reviewed: notes. Labs: ordered. Decision-making details documented in ED Course. Radiology: ordered and independent interpretation performed. Decision-making details documented in ED Course. ECG/medicine tests: ordered and independent interpretation performed. Decision-making details documented in ED Course.  Risk OTC drugs. Prescription drug management.   Pt with multiple medical problems and comorbidities and presenting today with a complaint that caries a high risk for morbidity and mortality.  Here today due to syncope.  This is patient's second episode in the last 1 week.  The first episode occurred prior to stopping the kratom and starting Suboxone.  However between the 1st and 2nd episode patient has not been feeling great and has been going through withdrawal.  He denies any infectious symptoms.  He has no chest pain, palpitations or shortness of breath.  No prior history of similar.  Patient was wearing his insulin pump and blood sugar was constantly being monitored and there was no evidence  of the hypoglycemic event causing today's event.  Concern  for dehydration, electrolyte abnormality, rhabdomyolysis, dysrhythmia as a cause of patient's symptoms.  Also concern for possible injury from the event today.  Low suspicion for ACS or PE.  Patient has no history concerning for PE and is PERC negative.  Patient did not appear to be injured from the event 1 week ago. 10:28 AM I independent interpreted patient's labs and EKG.  EKG is normal sinus rhythm and a normal EKG.  No findings concerning for prolonged QT, Brugada's, WPW.  Patient has been on continuous cardiac monitoring which I independently interpreted and has been in a sinus rhythm.  CBC today without acute findings, VBG without evidence of DKA, CMP with mild bump in creatinine of 1.4 up from 1.3 a few months ago with normal electrolytes and anion gap.  Troponin is less than 15 and magnesium was normal.  Acute CK was mildly elevated at 759.  I have independently visualized and interpreted pt's images today.  Chest x-ray without acute findings and shoulder imaging was negative for fracture or dislocation.  Head CT without intracranial hemorrhage.  And cervical spine without fracture.  Radiology reports that there was no acute intercranial abnormality but partial opacification of the left frontal sinus and left mastoid effusion which patient is already aware of because of a deviated septum and is planning on getting surgery in the future.  Also they report a moderate left foraminal narrowing at C5-6 and moderate narrowing at C6-7 due to vertebral spurring as well as no facial fractures.  All the findings were discussed with the patient.  He was given Tylenol  but reports that has not helped with his pain.  Due to being on Suboxone opiates would not be effective and this was discussed with the patient.  He received IV fluids.  He was given Toradol  and some pain dose ketamine . When patient went to radiology he did report having a sensation of feeling dizzy like being on a boat or a merry-go-round and is  starting to develop a headache which is most likely related to concussion from falling and hitting his head this morning.  10:28 AM No significant orthostasis with standing.  Blood pressure does not significantly change.  Patient has now been monitored for 3-1/2 hours and has had no dysrhythmias.  The dizziness and headache he describes now seems more likely related to concussion from his fall and hitting his head today.  He has remained mentating normally.  Again head CT is negative.  All these findings were discussed with the patient.  Did discuss with him upon getting up in the morning sitting on the side of the couch and waiting for a little while before he stands up.  Following up with his PCP as he may need to wear a Zio patch to ensure there is no dysrhythmias.  Continue to monitor his blood sugar and he was going to increase the Suboxone to 2 tablets twice daily because of ongoing symptoms which are being monitored and directed by his PCP.  He will use Tylenol  as needed for headaches at home in addition to the other medication he is taking.  He was given follow-up with the concussion clinic. Discussed all the results with the patient and at this time no indication for admission.  Patient is comfortable with this plan.      Final diagnoses:  Syncope, unspecified syncope type  Concussion with loss of consciousness of 30 minutes or less, initial encounter  Laceration  of internal mouth, initial encounter  Dehydration    ED Discharge Orders     None          Doretha Folks, MD 04/20/24 1028

## 2024-04-20 NOTE — ED Notes (Addendum)
 Provided pt with urinal for urine sample collection. Pt unable to void at this time

## 2024-04-20 NOTE — Discharge Instructions (Addendum)
 All the blood work today looked okay as we discussed except your kidney function was up a little bit from what it was a few months ago.  You were given IV fluids and some pain medications here.  It is most likely that you have a concussion from hitting your head on the coffee table today.  Everything with your heart has looked good here and there is been no abnormal rhythms but will be important to follow-up with your doctor to have this continually followed.  Start taking the 2 tablets of Suboxone today to help with the withdrawal symptoms you are feeling and make sure to stay hydrated.  Continue managing your diabetes as you have been doing a very good job lately.  Also do not eat any foods that are spicy, salty or have sharp edges for the next few days while your mouth is healing.  All the x-rays today look normal without anything broken.  It is okay to take Tylenol  as needed for the headache and occasional ibuprofen.  Make sure in the mornings when you wake up you sit upright for a little while before you try standing to avoid another episode like what happened today.

## 2024-04-20 NOTE — ED Notes (Signed)
 Patient transported to CT
